# Patient Record
Sex: Female | Born: 1995 | Race: White | Hispanic: No | Marital: Single | Smoking: Never smoker
Health system: Southern US, Community
[De-identification: ages and names within clinical notes are randomized; demographics above are authoritative.]

---
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
## 2016-05-12 ENCOUNTER — Other Ambulatory Visit: Payer: Self-pay | Admitting: Internal Medicine

## 2016-05-18 ENCOUNTER — Ambulatory Visit (INDEPENDENT_AMBULATORY_CARE_PROVIDER_SITE_OTHER): Payer: BLUE CROSS/BLUE SHIELD | Admitting: Internal Medicine

## 2016-05-18 ENCOUNTER — Encounter: Payer: Self-pay | Admitting: Internal Medicine

## 2016-05-18 VITALS — BP 118/76 | HR 84 | Temp 98.9°F | Ht 67.5 in | Wt 154.0 lb

## 2016-05-18 DIAGNOSIS — Z0000 Encounter for general adult medical examination without abnormal findings: Secondary | ICD-10-CM | POA: Diagnosis not present

## 2016-05-18 DIAGNOSIS — Z23 Encounter for immunization: Secondary | ICD-10-CM | POA: Diagnosis not present

## 2016-05-18 DIAGNOSIS — Z111 Encounter for screening for respiratory tuberculosis: Secondary | ICD-10-CM

## 2016-05-18 MED ORDER — NORETHINDRONE ACET-ETHINYL EST 1-20 MG-MCG PO TABS: 1.0000 | ORAL_TABLET | Freq: Every day | ORAL | Status: DC

## 2016-05-18 NOTE — Addendum Note (Signed)
Addended by: Roena MaladyEVONTENNO, Abdulrahman Bracey Y on: 05/18/2016 04:54 PM   Modules accepted: Orders, SmartSet

## 2016-05-18 NOTE — Progress Notes (Signed)
Subjective:    Patient ID: Candace Mcfarland, female    DOB: 22-Jan-1996, 20 y.o.   MRN: [id number]  HPI  Pt presents to the clinic today for her annual exam.  Flu: 10/2014 Tetanus: 2011 Dentist: biannually  [facility] reviewed, she has never had HPV vaccines  Diet: She does eat meat. She consumes fruits and veggies daily. She does eat some fried food. She drinks mostly water and soda. Exercise: [place]  Review of Systems      No past medical history on file.  Current Outpatient Prescriptions  Medication Sig Dispense Refill  . adapalene (DIFFERIN) 0.1 % cream Apply 1 application topically at bedtime.     . clindamycin (CLEOCIN T) 1 % lotion Apply 1 application topically daily.     . norethindrone-ethinyl estradiol (JUNEL 1/20) 1-20 MG-MCG tablet Take 1 tablet by mouth daily. PLEASE SCHEDULE ANNUAL PHYSICAL FOR MORE REFILLS 1 Package 0   No current facility-administered medications for this visit.    No Known Allergies  Family History  Problem Relation Age of Onset  . Hypertension Father   . Cancer Maternal Grandfather     lung  . Cancer Paternal Grandmother     skin    Social History   Social History  . Marital Status: Single    Spouse Name: N/A  . Number of Children: N/A  . Years of Education: N/A   Occupational History  . Not on file.   Social History Main Topics  . Smoking status: Never Smoker   . Smokeless tobacco: Never Used  . Alcohol Use: No  . Drug Use: Not on file  . Sexual Activity: No   Other Topics Concern  . Not on file   Social History Narrative     Constitutional: Denies fever, malaise, fatigue, headache or abrupt weight changes.  HEENT: Denies eye pain, eye redness, ear pain, ringing in the ears, wax buildup, runny nose, nasal congestion, bloody nose, or sore throat. Respiratory: Denies difficulty breathing, shortness of breath, cough or sputum production.   Cardiovascular: Denies chest pain, chest tightness, palpitations or swelling in  the hands or feet.  Gastrointestinal: Denies abdominal pain, bloating, constipation, diarrhea or blood in the stool.  GU: Denies urgency, frequency, pain with urination, burning sensation, blood in urine, odor or discharge. Musculoskeletal: Denies decrease in range of motion, difficulty with gait, muscle pain or joint pain and swelling.  Skin: Pt reports acne. Denies redness, rashes, or ulcercations.  Neurological: Denies dizziness, difficulty with memory, difficulty with speech or problems with balance and coordination.  Psych: Denies anxiety, depression, SI/HI.  No other specific complaints in a complete review of systems (except as listed in HPI above).  Objective:   Physical Exam  BP 118/76 mmHg  Pulse 84  Temp(Src) 98.9 F (37.2 C) (Oral)  Ht 5' 7.5" (1.715 m)  Wt 154 lb (69.854 kg)  BMI 23.75 kg/m2  SpO2 98%  LMP 05/08/2016  Wt Readings from Last 3 Encounters:  06/19/15 160 lb (72.576 kg) (90 %*, Z = 1.29)  06/09/15 156 lb (70.761 kg) (88 %*, Z = 1.19)  11/07/14 164 lb (74.39 kg) (92 %*, Z = 1.42)   * Growth percentiles are based on CDC 2-20 Years data.    General: Appears her stated age, well developed, well nourished in NAD. Skin: Warm, dry and intact. Liner cut marks noted on left upper thigh. HEENT: Head: normal shape and size; Eyes: sclera white, no icterus, conjunctiva pink, PERRLA and EOMs intact; Ears: Tm's  gray and intact, normal light reflex, some wax buildup; Throat/Mouth: Teeth present, mucosa pink and moist, no exudate, lesions or ulcerations noted.  Neck: Neck supple, trachea midline. No masses, lumps or thyromegaly present.  Cardiovascular: Normal rate and rhythm. S1,S2 noted.  No murmur, rubs or gallops noted.  Pulmonary/Chest: Normal effort and positive vesicular breath sounds. No respiratory distress. No wheezes, rales or ronchi noted.  Abdomen: Soft and nontender. Normal bowel sounds. No distention or masses noted. Liver, spleen and kidneys non  palpable. Musculoskeletal: Normal range of motion. Strength 5/5 BUE/BLE. No difficulty with gait.  Neurological: Alert and oriented. Cranial nerves II-XII grossly intact. Coordination normal.  Psychiatric: Mood and affect normal. Behavior is normal. Judgment and thought content normal.         Assessment & Plan:  Preventative Health Maintenance.  Encouraged her to get a flu shot in the fall Tetanus UTD Encouraged her to visit a dentist annually Encouraged her to continue to be active and consume a well balanced diet Will check CBC, CMET, Lipid today Qunatiferon TB test ordered for college form HPV # 1 today, make nurse visits for subsequent vaccines  RTC in 1 year or sooner if needed Candace Reaper, NP

## 2016-05-18 NOTE — Patient Instructions (Signed)
Health Maintenance, Female Adopting a healthy lifestyle and getting preventive care can go a long way to promote health and wellness. Talk with your health care provider about what schedule of regular examinations is right for you. This is a good chance for you to check in with your provider about disease prevention and staying healthy. In between checkups, there are plenty of things you can do on your own. Experts have done a lot of research about which lifestyle changes and preventive measures are most likely to keep you healthy. Ask your health care provider for more information. WEIGHT AND DIET  Eat a healthy diet  Be sure to include plenty of vegetables, fruits, low-fat dairy products, and lean protein.  Do not eat a lot of foods high in solid fats, added sugars, or salt.  Get regular exercise. This is one of the most important things you can do for your health.  Most adults should exercise for at least 150 minutes each week. The exercise should increase your heart rate and make you sweat (moderate-intensity exercise).  Most adults should also do strengthening exercises at least twice a week. This is in addition to the moderate-intensity exercise.  Maintain a healthy weight  Body mass index (BMI) is a measurement that can be used to identify possible weight problems. It estimates body fat based on height and weight. Your health care provider can help determine your BMI and help you achieve or maintain a healthy weight.  For females 20 years of age and older:   A BMI below 18.5 is considered underweight.  A BMI of 18.5 to 24.9 is normal.  A BMI of 25 to 29.9 is considered overweight.  A BMI of 30 and above is considered obese.  Watch levels of cholesterol and blood lipids  You should start having your blood tested for lipids and cholesterol at 20 years of age, then have this test every 5 years.  You may need to have your cholesterol levels checked more often if:  Your lipid  or cholesterol levels are high.  You are older than 19 years of age.  You are at high risk for heart disease.  CANCER SCREENING   Lung Cancer  Lung cancer screening is recommended for adults 55-80 years old who are at high risk for lung cancer because of a history of smoking.  A yearly low-dose CT scan of the lungs is recommended for people who:  Currently smoke.  Have quit within the past 15 years.  Have at least a 30-pack-year history of smoking. A pack year is smoking an average of one pack of cigarettes a day for 1 year.  Yearly screening should continue until it has been 15 years since you quit.  Yearly screening should stop if you develop a health problem that would prevent you from having lung cancer treatment.  Breast Cancer  Practice breast self-awareness. This means understanding how your breasts normally appear and feel.  It also means doing regular breast self-exams. Let your health care provider know about any changes, no matter how small.  If you are in your 20s or 30s, you should have a clinical breast exam (CBE) by a health care provider every 1-3 years as part of a regular health exam.  If you are 40 or older, have a CBE every year. Also consider having a breast X-ray (mammogram) every year.  If you have a family history of breast cancer, talk to your health care provider about genetic screening.  If you   are at high risk for breast cancer, talk to your health care provider about having an MRI and a mammogram every year.  Breast cancer gene (BRCA) assessment is recommended for women who have family members with BRCA-related cancers. BRCA-related cancers include:  Breast.  Ovarian.  Tubal.  Peritoneal cancers.  Results of the assessment will determine the need for genetic counseling and BRCA1 and BRCA2 testing. Cervical Cancer Your health care provider may recommend that you be screened regularly for cancer of the pelvic organs (ovaries, uterus, and  vagina). This screening involves a pelvic examination, including checking for microscopic changes to the surface of your cervix (Pap test). You may be encouraged to have this screening done every 3 years, beginning at age 21.  For women ages 30-65, health care providers may recommend pelvic exams and Pap testing every 3 years, or they may recommend the Pap and pelvic exam, combined with testing for human papilloma virus (HPV), every 5 years. Some types of HPV increase your risk of cervical cancer. Testing for HPV may also be done on women of any age with unclear Pap test results.  Other health care providers may not recommend any screening for nonpregnant women who are considered low risk for pelvic cancer and who do not have symptoms. Ask your health care provider if a screening pelvic exam is right for you.  If you have had past treatment for cervical cancer or a condition that could lead to cancer, you need Pap tests and screening for cancer for at least 20 years after your treatment. If Pap tests have been discontinued, your risk factors (such as having a new sexual partner) need to be reassessed to determine if screening should resume. Some women have medical problems that increase the chance of getting cervical cancer. In these cases, your health care provider may recommend more frequent screening and Pap tests. Colorectal Cancer  This type of cancer can be detected and often prevented.  Routine colorectal cancer screening usually begins at 19 years of age and continues through 19 years of age.  Your health care provider may recommend screening at an earlier age if you have risk factors for colon cancer.  Your health care provider may also recommend using home test kits to check for hidden blood in the stool.  A small camera at the end of a tube can be used to examine your colon directly (sigmoidoscopy or colonoscopy). This is done to check for the earliest forms of colorectal  cancer.  Routine screening usually begins at age 50.  Direct examination of the colon should be repeated every 5-10 years through 19 years of age. However, you may need to be screened more often if early forms of precancerous polyps or small growths are found. Skin Cancer  Check your skin from head to toe regularly.  Tell your health care provider about any new moles or changes in moles, especially if there is a change in a mole's shape or color.  Also tell your health care provider if you have a mole that is larger than the size of a pencil eraser.  Always use sunscreen. Apply sunscreen liberally and repeatedly throughout the day.  Protect yourself by wearing long sleeves, pants, a wide-brimmed hat, and sunglasses whenever you are outside. HEART DISEASE, DIABETES, AND HIGH BLOOD PRESSURE   High blood pressure causes heart disease and increases the risk of stroke. High blood pressure is more likely to develop in:  People who have blood pressure in the high end   of the normal range (130-139/85-89 mm Hg).  People who are overweight or obese.  People who are African American.  If you are 38-23 years of age, have your blood pressure checked every 3-5 years. If you are 61 years of age or older, have your blood pressure checked every year. You should have your blood pressure measured twice--once when you are at a hospital or clinic, and once when you are not at a hospital or clinic. Record the average of the two measurements. To check your blood pressure when you are not at a hospital or clinic, you can use:  An automated blood pressure machine at a pharmacy.  A home blood pressure monitor.  If you are between 45 years and 39 years old, ask your health care provider if you should take aspirin to prevent strokes.  Have regular diabetes screenings. This involves taking a blood sample to check your fasting blood sugar level.  If you are at a normal weight and have a low risk for diabetes,  have this test once every three years after 19 years of age.  If you are overweight and have a high risk for diabetes, consider being tested at a younger age or more often. PREVENTING INFECTION  Hepatitis B  If you have a higher risk for hepatitis B, you should be screened for this virus. You are considered at high risk for hepatitis B if:  You were born in a country where hepatitis B is common. Ask your health care provider which countries are considered high risk.  Your parents were born in a high-risk country, and you have not been immunized against hepatitis B (hepatitis B vaccine).  You have HIV or AIDS.  You use needles to inject street drugs.  You live with someone who has hepatitis B.  You have had sex with someone who has hepatitis B.  You get hemodialysis treatment.  You take certain medicines for conditions, including cancer, organ transplantation, and autoimmune conditions. Hepatitis C  Blood testing is recommended for:  Everyone born from [birth year] through 1964.  Anyone with known risk factors for hepatitis C. Sexually transmitted infections (STIs)  You should be screened for sexually transmitted infections (STIs) including gonorrhea and chlamydia if:  You are sexually active and are younger than 19 years of age.  You are older than 19 years of age and your health care provider tells you that you are at risk for this type of infection.  Your sexual activity has changed since you were last screened and you are at an increased risk for chlamydia or gonorrhea. Ask your health care provider if you are at risk.  If you do not have HIV, but are at risk, it may be recommended that you take a prescription medicine daily to prevent HIV infection. This is called pre-exposure prophylaxis (PrEP). You are considered at risk if:  You are sexually active and do not regularly use condoms or know the HIV status of your partner(s).  You take drugs by injection.  You are sexually  active with a partner who has HIV. Talk with your health care provider about whether you are at high risk of being infected with HIV. If you choose to begin PrEP, you should first be tested for HIV. You should then be tested every 3 months for as long as you are taking PrEP.  PREGNANCY   If you are premenopausal and you may become pregnant, ask your health care provider about preconception counseling.  If you may  become pregnant, take 400 to 800 micrograms (mcg) of folic acid every day.  If you want to prevent pregnancy, talk to your health care provider about birth control (contraception). OSTEOPOROSIS AND MENOPAUSE   Osteoporosis is a disease in which the bones lose minerals and strength with aging. This can result in serious bone fractures. Your risk for osteoporosis can be identified using a bone density scan.  If you are 61 years of age or older, or if you are at risk for osteoporosis and fractures, ask your health care provider if you should be screened.  Ask your health care provider whether you should take a calcium or vitamin D supplement to lower your risk for osteoporosis.  Menopause may have certain physical symptoms and risks.  Hormone replacement therapy may reduce some of these symptoms and risks. Talk to your health care provider about whether hormone replacement therapy is right for you.  HOME CARE INSTRUCTIONS   Schedule regular health, dental, and eye exams.  Stay current with your immunizations.   Do not use any tobacco products including cigarettes, chewing tobacco, or electronic cigarettes.  If you are pregnant, do not drink alcohol.  If you are breastfeeding, limit how much and how often you drink alcohol.  Limit alcohol intake to no more than 1 drink per day for nonpregnant women. One drink equals 12 ounces of beer, 5 ounces of wine, or 1 ounces of hard liquor.  Do not use street drugs.  Do not share needles.  Ask your health care provider for help if  you need support or information about quitting drugs.  Tell your health care provider if you often feel depressed.  Tell your health care provider if you have ever been abused or do not feel safe at home.   This information is not intended to replace advice given to you by your health care provider. Make sure you discuss any questions you have with your health care provider.   Document Released: 05/10/2011 Document Revised: 11/15/2014 Document Reviewed: 09/26/2013 Elsevier Interactive Patient Education [employer].

## 2016-05-19 LAB — CBC
HEMATOCRIT: 42.1 % (ref 36.0–49.0)
HEMOGLOBIN: 14.2 g/dL (ref 12.0–16.0)
MCHC: 33.7 g/dL (ref 31.0–37.0)
MCV: 85.6 fl (ref 78.0–98.0)
PLATELETS: 279 10*3/uL (ref 150.0–575.0)
RBC: 4.92 Mil/uL (ref 3.80–5.70)
RDW: 14 % (ref 11.4–15.5)
WBC: 7.2 10*3/uL (ref 4.5–13.5)

## 2016-05-19 LAB — COMPREHENSIVE METABOLIC PANEL
ALBUMIN: 4.7 g/dL (ref 3.5–5.2)
ALT: 16 U/L (ref 0–35)
AST: 18 U/L (ref 0–37)
Alkaline Phosphatase: 52 U/L (ref 47–119)
BUN: 12 mg/dL (ref 6–23)
CALCIUM: 10.5 mg/dL (ref 8.4–10.5)
CHLORIDE: 101 meq/L (ref 96–112)
CO2: 26 mEq/L (ref 19–32)
CREATININE: 0.87 mg/dL (ref 0.40–1.20)
GFR: 89.5 mL/min (ref >60.00)
Glucose, Bld: 128 mg/dL — ABNORMAL HIGH (ref 70–99)
POTASSIUM: 4.1 meq/L (ref 3.5–5.1)
Sodium: 137 mEq/L (ref 135–145)
Total Bilirubin: 0.5 mg/dL (ref 0.3–1.2)
Total Protein: 8.3 g/dL (ref 6.0–8.3)

## 2016-05-19 LAB — LIPID PANEL
CHOLESTEROL: 202 mg/dL — AB (ref 0–200)
HDL: 65.1 mg/dL (ref >39.00)
LDL CALC: 108 mg/dL — AB (ref 0–99)
NonHDL: 136.59
TRIGLYCERIDES: 141 mg/dL (ref 0.0–149.0)
Total CHOL/HDL Ratio: 3
VLDL: 28.2 mg/dL (ref 0.0–40.0)

## 2016-05-20 LAB — QUANTIFERON TB GOLD ASSAY (BLOOD)
Interferon Gamma Release Assay: NEGATIVE
Mitogen-Nil: >10 IU/mL
Quantiferon Nil Value: 0.09 IU/mL
Quantiferon Tb Ag Minus Nil Value: 0.01 IU/mL

## 2016-06-08 DIAGNOSIS — Z7689 Persons encountering health services in other specified circumstances: Secondary | ICD-10-CM

## 2016-06-23 ENCOUNTER — Telehealth: Payer: Self-pay | Admitting: *Deleted

## 2016-06-23 NOTE — Telephone Encounter (Signed)
PT came in to drop off form to be completed regarding immunizations. Pt stated she had gotten the information previously but unfortunately lost it. Please let her know when it is ready [phone]. Form placed in prescription tower.

## 2016-06-24 NOTE — Telephone Encounter (Signed)
Placed in front office for pick up--pt is aware

## 2016-12-31 ENCOUNTER — Encounter: Payer: Self-pay | Admitting: Internal Medicine

## 2016-12-31 ENCOUNTER — Encounter (INDEPENDENT_AMBULATORY_CARE_PROVIDER_SITE_OTHER): Payer: Self-pay

## 2016-12-31 ENCOUNTER — Ambulatory Visit (INDEPENDENT_AMBULATORY_CARE_PROVIDER_SITE_OTHER): Payer: BLUE CROSS/BLUE SHIELD | Admitting: Internal Medicine

## 2016-12-31 VITALS — BP 120/70 | HR 100 | Temp 97.9°F | Wt 163.0 lb

## 2016-12-31 DIAGNOSIS — Z298 Encounter for other specified prophylactic measures: Secondary | ICD-10-CM | POA: Diagnosis not present

## 2016-12-31 DIAGNOSIS — Z7189 Other specified counseling: Secondary | ICD-10-CM

## 2016-12-31 DIAGNOSIS — R0789 Other chest pain: Secondary | ICD-10-CM

## 2016-12-31 DIAGNOSIS — R7989 Other specified abnormal findings of blood chemistry: Secondary | ICD-10-CM | POA: Diagnosis not present

## 2016-12-31 DIAGNOSIS — Z23 Encounter for immunization: Secondary | ICD-10-CM

## 2016-12-31 DIAGNOSIS — Z7184 Encounter for health counseling related to travel: Secondary | ICD-10-CM

## 2016-12-31 MED ORDER — ATOVAQUONE-PROGUANIL HCL 250-100 MG PO TABS: 1.0000 | ORAL_TABLET | Freq: Every day | ORAL | 0 refills | Status: DC

## 2016-12-31 MED ORDER — TYPHOID VACCINE PO CPDR: 1.0000 | DELAYED_RELEASE_CAPSULE | ORAL | 0 refills | Status: DC

## 2016-12-31 NOTE — Progress Notes (Signed)
Subjective:    Patient ID: Candace Mcfarland, female    DOB: [birth year], 21 y.o.   MRN: [id number]  HPI  Pt presents to the clinic today for ER follow up for elevated D dimer and chest pain. She reports she started having substernal chest pain Friday 11/26/16. [facility] They told her it was most likely anxiety and gave her a RX for Ativan. They did some blood work and the next day, called her and told her that her D dimer was elevated and that she needed to go to the ER. [facility], where the did BLE venous dopplers which were negative. She also had a chest CTA which was negative for PE. By the time she had gotten to the ER, her chest pain subsided, and she has not had any issues since. She did stop her OCP's just to be on the safe side. She was taking it for acne regulation. She just wanted to follow up because [place] is [place] to [place], and wants to make sure she is not going to have any issues. She is also requesting her typhoid vaccine and malaria prophylaxis.  Review of Systems      No past medical history on file.  Current Outpatient Prescriptions  Medication Sig Dispense Refill  . adapalene (DIFFERIN) 0.1 % cream Apply 1 application topically at bedtime.     . clindamycin (CLEOCIN T) 1 % lotion Apply 1 application topically daily.     Marland Kitchen. FLUoxetine (PROZAC) 10 MG capsule TAKE 1 CAPSULE BY MOUTH DAILY IN THE MORNING  0  . hydrOXYzine (ATARAX/VISTARIL) 25 MG tablet Take 25 mg by mouth daily as needed.   0   No current facility-administered medications for this visit.     No Known Allergies  Family History  Problem Relation Age of Onset  . Hypertension Father   . Cancer Maternal Grandfather     lung  . Cancer Paternal Grandmother     skin    Social History   Social History  . Marital status: Single    Spouse name: N/A  . Number of children: N/A  . Years of education: N/A   Occupational History  . Not on file.   Social History  Main Topics  . Smoking status: Never Smoker  . Smokeless tobacco: Never Used  . Alcohol use No  . Drug use: Unknown  . Sexual activity: No   Other Topics Concern  . Not on file   Social History Narrative  . No narrative on file     Constitutional: Denies fever, malaise, fatigue, headache or abrupt weight changes.  Respiratory: Denies difficulty breathing, shortness of breath, cough or sputum production.   Cardiovascular: Denies chest pain, chest tightness, palpitations or swelling in the hands or feet.   No other specific complaints in a complete review of systems (except as listed in HPI above).  Objective:   Physical Exam   BP 120/70   Pulse 100   Temp 97.9 F (36.6 C) (Oral)   Wt 163 lb (73.9 kg)   LMP 12/24/2016   SpO2 99%   BMI 25.15 kg/m  Wt Readings from Last 3 Encounters:  12/31/16 163 lb (73.9 kg) (89 %, Z= 1.25)*  05/18/16 154 lb (69.9 kg) (86 %, Z= 1.06)*  06/19/15 160 lb (72.6 kg) (90 %, Z= 1.29)*   * Growth percentiles are based on CDC 2-20 Years data.    General: Appears her stated age,  well developed, well nourished in NAD. Cardiovascular: Normal rate and rhythm. S1,S2 noted.  No murmur, rubs or gallops noted.  Pulmonary/Chest: Normal effort and positive vesicular breath sounds. No respiratory distress. No wheezes, rales or ronchi noted.   BMET    Component Value Date/Time   NA 137 05/18/2016 1618   K 4.1 05/18/2016 1618   CL 101 05/18/2016 1618   CO2 26 05/18/2016 1618   GLUCOSE 128 (H) 05/18/2016 1618   BUN 12 05/18/2016 1618   CREATININE 0.87 05/18/2016 1618   CALCIUM 10.5 05/18/2016 1618    Lipid Panel     Component Value Date/Time   CHOL 202 (H) 05/18/2016 1618   TRIG 141.0 05/18/2016 1618   HDL 65.10 05/18/2016 1618   CHOLHDL 3 05/18/2016 1618   VLDL 28.2 05/18/2016 1618   LDLCALC 108 (H) 05/18/2016 1618    CBC    Component Value Date/Time   WBC 7.2 05/18/2016 1618   RBC 4.92 05/18/2016 1618   HGB 14.2 05/18/2016 1618    HCT 42.1 05/18/2016 1618   PLT 279.0 05/18/2016 1618   MCV 85.6 05/18/2016 1618   MCHC 33.7 05/18/2016 1618   RDW 14.0 05/18/2016 1618    Hgb A1C No results found for: HGBA1C         Assessment & Plan:   ER follow up for elevated D dimer and chest pain:  ER notes, labs and imaging reviewed No clot Symptoms have resolved She does not want to restart OCP's, I am fine with this She will take a baby ASA the day before she leaves and continue until she comes home Encouraged her to pump her legs every hour while she was on her flight  Encounter for travel visit:  RX for typhoid oral vaccine and malaria prophylaxis  RTC as needed [name], NP

## 2016-12-31 NOTE — Patient Instructions (Signed)

## 2017-01-07 ENCOUNTER — Telehealth: Payer: Self-pay

## 2017-01-07 NOTE — Telephone Encounter (Signed)
Pt left v/m; pt seen 12/31/16 and was given Malarone and Typhoid cap. Pt is supposed to finish taking Typhoid cap on 01/13/17 and start taking Malarone on 01/13/17. Pt wants to know if can take both meds at same time on 01/13/17 or do they have to be spaced apart and if so how far apart to take meds. Pt request cb.

## 2017-01-07 NOTE — Telephone Encounter (Signed)
Yes, can be taken at the same time.

## 2017-01-07 NOTE — Telephone Encounter (Signed)
Left message on voicemail.

## 2017-01-12 ENCOUNTER — Telehealth: Payer: Self-pay | Admitting: Internal Medicine

## 2017-01-12 NOTE — Telephone Encounter (Signed)
[facility]  Please call.

## 2017-01-13 NOTE — Telephone Encounter (Signed)
Left message on voicemail.

## 2017-05-20 ENCOUNTER — Ambulatory Visit (INDEPENDENT_AMBULATORY_CARE_PROVIDER_SITE_OTHER): Payer: BLUE CROSS/BLUE SHIELD | Admitting: Internal Medicine

## 2017-05-20 ENCOUNTER — Encounter: Payer: Self-pay | Admitting: Internal Medicine

## 2017-05-20 VITALS — BP 112/78 | HR 64 | Temp 98.2°F | Ht 67.5 in | Wt 154.5 lb

## 2017-05-20 DIAGNOSIS — Z0001 Encounter for general adult medical examination with abnormal findings: Secondary | ICD-10-CM

## 2017-05-20 DIAGNOSIS — Z0000 Encounter for general adult medical examination without abnormal findings: Secondary | ICD-10-CM | POA: Diagnosis not present

## 2017-05-20 NOTE — Patient Instructions (Signed)

## 2017-05-20 NOTE — Progress Notes (Signed)
Subjective:    Patient ID: Candace Mcfarland, female    DOB: Sep 27, 1996, 21 y.o.   MRN: [id number]  HPI  Pt presents to the clinic today for her annual exam. She is also due to follow up chronic conditions.  Anxiety: She is taking Prozac daily as prescribed. She has Hydroxyzine but reports she does not take it. She denies depression, SI/HI. She follows with Colette Ribas at [facility]  Flu: 08/2015 Tetanus: 06/2010 Dentist: annually  Diet: She does eat meat. She consumes fruits and veggies. She does eat fried foods. She drinks mostly water and coffee.  Exercise: None  Review of Systems  No past medical history on file.  Current Outpatient Prescriptions  Medication Sig Dispense Refill  . adapalene (DIFFERIN) 0.1 % cream Apply 1 application topically at bedtime.     Marland Kitchen atovaquone-proguanil (MALARONE) 250-100 MG TABS tablet Take 1 tablet by mouth daily. Start 3/8, Take last pill on 3/26 19 tablet 0  . clindamycin (CLEOCIN T) 1 % lotion Apply 1 application topically daily.     Marland Kitchen FLUoxetine (PROZAC) 10 MG capsule TAKE 1 CAPSULE BY MOUTH DAILY IN THE MORNING  0  . hydrOXYzine (ATARAX/VISTARIL) 25 MG tablet Take 25 mg by mouth daily as needed.   0  . typhoid (VIVOTIF) DR capsule Take 1 capsule by mouth every other day. Start 3/2. Should Finish 3/8 4 capsule 0   No current facility-administered medications for this visit.     No Known Allergies  Family History  Problem Relation Age of Onset  . Hypertension Father   . Cancer Maternal Grandfather        lung  . Cancer Paternal Grandmother        skin    Social History   Social History  . Marital status: Single    Spouse name: N/A  . Number of children: N/A  . Years of education: N/A   Occupational History  . Not on file.   Social History Main Topics  . Smoking status: Never Smoker  . Smokeless tobacco: Never Used  . Alcohol use No  . Drug use: Unknown  . Sexual activity: No   Other Topics Concern  . Not on file    Social History Narrative  . No narrative on file     Constitutional: Denies fever, malaise, fatigue, headache or abrupt weight changes.  HEENT: Denies eye pain, eye redness, ear pain, ringing in the ears, wax buildup, runny nose, nasal congestion, bloody nose, or sore throat. Respiratory: Denies difficulty breathing, shortness of breath, cough or sputum production.   Cardiovascular: Denies chest pain, chest tightness, palpitations or swelling in the hands or feet.  Gastrointestinal: Denies abdominal pain, bloating, constipation, diarrhea or blood in the stool.  GU: Denies urgency, frequency, pain with urination, burning sensation, blood in urine, odor or discharge. Musculoskeletal: Denies decrease in range of motion, difficulty with gait, muscle pain or joint pain and swelling.  Skin: Denies redness, rashes, lesions or ulcercations.  Neurological: Denies dizziness, difficulty with memory, difficulty with speech or problems with balance and coordination.  Psych: Denies anxiety, depression, SI/HI.  No other specific complaints in a complete review of systems (except as listed in HPI above).     Objective:   Physical Exam   BP 112/78 (BP Location: Right Arm, Patient Position: Sitting, Cuff Size: Normal)   Pulse 64   Temp 98.2 F (36.8 C) (Oral)   Ht 5' 7.5" (1.715 m)   Wt 154 lb 8 oz (70.1  kg)   LMP 05/16/2017   SpO2 98%   BMI 23.84 kg/m  Wt Readings from Last 3 Encounters:  05/20/17 154 lb 8 oz (70.1 kg) (84 %, Z= 0.99)*  12/31/16 163 lb (73.9 kg) (89 %, Z= 1.25)*  05/18/16 154 lb (69.9 kg) (86 %, Z= 1.06)*   * Growth percentiles are based on CDC 2-20 Years data.    General: Appears her stated age, well developed, well nourished in NAD. Skin: Warm, dry and intact. HEENT: Head: normal shape and size; Eyes: sclera white, no icterus, conjunctiva pink, PERRLA and EOMs intact; Ears: Tm's gray and intact, normal light reflex; Throat/Mouth: Teeth present, mucosa pink and moist,  no exudate, lesions or ulcerations noted.  Neck:  Neck supple, trachea midline. Cervical anterior adenopathy noted on the right. No thyromegaly present.  Cardiovascular: Normal rate and rhythm. S1,S2 noted.  No murmur, rubs or gallops noted. No JVD or BLE edema.  Pulmonary/Chest: Normal effort and positive vesicular breath sounds. No respiratory distress. No wheezes, rales or ronchi noted.  Abdomen: Soft and nontender. Normal bowel sounds. No distention or masses noted.  Musculoskeletal: Strength 5/5 BUE/BLE. No difficulty with gait.  Neurological: Alert and oriented. Cranial nerves II-XII grossly intact. Coordination normal.  Psychiatric: Mood and affect normal. Behavior is normal. Judgment and thought content normal.     BMET    Component Value Date/Time   NA 137 05/18/2016 1618   K 4.1 05/18/2016 1618   CL 101 05/18/2016 1618   CO2 26 05/18/2016 1618   GLUCOSE 128 (H) 05/18/2016 1618   BUN 12 05/18/2016 1618   CREATININE 0.87 05/18/2016 1618   CALCIUM 10.5 05/18/2016 1618    Lipid Panel     Component Value Date/Time   CHOL 202 (H) 05/18/2016 1618   TRIG 141.0 05/18/2016 1618   HDL 65.10 05/18/2016 1618   CHOLHDL 3 05/18/2016 1618   VLDL 28.2 05/18/2016 1618   LDLCALC 108 (H) 05/18/2016 1618    CBC    Component Value Date/Time   WBC 7.2 05/18/2016 1618   RBC 4.92 05/18/2016 1618   HGB 14.2 05/18/2016 1618   HCT 42.1 05/18/2016 1618   PLT 279.0 05/18/2016 1618   MCV 85.6 05/18/2016 1618   MCHC 33.7 05/18/2016 1618   RDW 14.0 05/18/2016 1618    Hgb A1C No results found for: HGBA1C         Assessment & Plan:   Preventative Health Maintenance:  Encouraged her to get a flu shot in the fall Tetanus UTD Encouraged her to consume a balanced diet and exercise regimen Advised her to see a dentist annually She declines labs today  RTC in 1 year, sooner if needed Nicki ReaperBAITY, Sasha Rueth, NP

## 2018-05-23 ENCOUNTER — Encounter: Payer: BLUE CROSS/BLUE SHIELD | Admitting: Internal Medicine

## 2018-11-10 ENCOUNTER — Telehealth: Payer: Self-pay | Admitting: Internal Medicine

## 2018-11-10 NOTE — Telephone Encounter (Signed)
Not the way it works. You have to come in for a travel visit. I have no idea where she is going or if Typhoid (which is an oral vaccine) is even appropriate for where she is going.

## 2018-11-10 NOTE — Telephone Encounter (Signed)
Pt called office requesting a order to be put in to get a Typhoid shot at [facility] in [place]. She is leaving to go to [place] tomorrow.

## 2018-11-13 NOTE — Telephone Encounter (Signed)
Just now able to view msg, pt should already be gone for trip

## 2019-04-03 ENCOUNTER — Encounter: Payer: Self-pay | Admitting: Internal Medicine

## 2019-04-03 ENCOUNTER — Ambulatory Visit (INDEPENDENT_AMBULATORY_CARE_PROVIDER_SITE_OTHER): Payer: PRIVATE HEALTH INSURANCE | Admitting: Internal Medicine

## 2019-04-03 ENCOUNTER — Other Ambulatory Visit: Payer: Self-pay

## 2019-04-03 VITALS — Temp 99.5°F

## 2019-04-03 DIAGNOSIS — J038 Acute tonsillitis due to other specified organisms: Secondary | ICD-10-CM

## 2019-04-03 DIAGNOSIS — B9689 Other specified bacterial agents as the cause of diseases classified elsewhere: Secondary | ICD-10-CM | POA: Diagnosis not present

## 2019-04-03 MED ORDER — AMOXICILLIN 500 MG PO CAPS: 500.0000 mg | ORAL_CAPSULE | Freq: Three times a day (TID) | ORAL | 0 refills | Status: DC

## 2019-04-03 MED ORDER — PREDNISONE 10 MG PO TABS: ORAL_TABLET | ORAL | 0 refills | Status: DC

## 2019-04-03 NOTE — Progress Notes (Signed)
Virtual Visit via Video Note  I connected with Candace LawmanCamille J Mcfarland on 04/03/19 at  8:15 AM EDT by a video enabled telemedicine application and verified that I am speaking with the correct person using two identifiers.  Location: Patient: Home Provider: Office   I discussed the limitations of evaluation and management by telemedicine and the availability of in person appointments. The patient expressed understanding and agreed to proceed.  History of Present Illness:  Pt reports swollen tonsils and glands. This started 3-4 days ago but seems to be getting worse. The right is worse than the left. She is having difficulty swallowing. She reports associated runny nose, nasal congestion and fever. She sees white patches on the back of her throat. She denies chills or body aches. She has tried warm salt water gargles, Ibuprofen and Sudafed with minimal relief. She has not had sick contacts that she is aware of.     No past medical history on file.  Current Outpatient Medications  Medication Sig Dispense Refill  . FLUoxetine (PROZAC) 20 MG capsule Take 20 mg by mouth daily.      No current facility-administered medications for this visit.     No Known Allergies  Family History  Problem Relation Age of Onset  . Hypertension Father   . Cancer Maternal Grandfather        lung  . Cancer Paternal Grandmother        skin    Social History   Socioeconomic History  . Marital status: Single    Spouse name: Not on file  . Number of children: Not on file  . Years of education: Not on file  . Highest education level: Not on file  Occupational History  . Not on file  Social Needs  . Financial resource strain: Not on file  . Food insecurity:    Worry: Not on file    Inability: Not on file  . Transportation needs:    Medical: Not on file    Non-medical: Not on file  Tobacco Use  . Smoking status: Never Smoker  . Smokeless tobacco: Never Used  Substance and Sexual Activity  . Alcohol  use: No    Alcohol/week: 0.0 standard drinks  . Drug use: No  . Sexual activity: Never  Lifestyle  . Physical activity:    Days per week: Not on file    Minutes per session: Not on file  . Stress: Not on file  Relationships  . Social connections:    Talks on phone: Not on file    Gets together: Not on file    Attends religious service: Not on file    Active member of club or organization: Not on file    Attends meetings of clubs or organizations: Not on file    Relationship status: Not on file  . Intimate partner violence:    Fear of current or ex partner: Not on file    Emotionally abused: Not on file    Physically abused: Not on file    Forced sexual activity: Not on file  Other Topics Concern  . Not on file  Social History Narrative  . Not on file     Constitutional: Pt reports fever and malaise. Denies fatigue, headache or abrupt weight changes.  HEENT: Pt reports runny nose, nasal congestion, sore throat and swollen glands. Denies eye pain, eye redness, ear pain, ringing in the ears, wax buildup, runny nose, nasal congestion, bloody nose. Respiratory: Denies difficulty breathing, shortness of breath,  cough or sputum production.   Cardiovascular: Denies chest pain, chest tightness, palpitations or swelling in the hands or feet.  Skin: Denies redness, rashes, lesions or ulcercations.   No other specific complaints in a complete review of systems (except as listed in HPI above).  Temp 99.5 F (37.5 C) (Oral)   Wt Readings from Last 3 Encounters:  05/20/17 154 lb 8 oz (70.1 kg) (84 %, Z= 0.99)*  12/31/16 163 lb (73.9 kg) (89 %, Z= 1.25)*  05/18/16 154 lb (69.9 kg) (86 %, Z= 1.06)*   * Growth percentiles are based on CDC (Girls, 2-20 Years) data.    General: Appears her stated age, in NAD. Skin: Warm, dry and intact. No rashes noted. HEENT: Unable to visualize posterior pharynx. She reports tonsils are almost touching uvula. Tonsils have white patches bilaterally.  She reports uvula rises midline. You can tell by her voice that there is swelling of the throat present. Pulmonary/Chest: Normal effort. No respiratory distress. Neurological: Alert and oriented.    BMET    Component Value Date/Time   NA 137 05/18/2016 1618   K 4.1 05/18/2016 1618   CL 101 05/18/2016 1618   CO2 26 05/18/2016 1618   GLUCOSE 128 (H) 05/18/2016 1618   BUN 12 05/18/2016 1618   CREATININE 0.87 05/18/2016 1618   CALCIUM 10.5 05/18/2016 1618    Lipid Panel     Component Value Date/Time   CHOL 202 (H) 05/18/2016 1618   TRIG 141.0 05/18/2016 1618   HDL 65.10 05/18/2016 1618   CHOLHDL 3 05/18/2016 1618   VLDL 28.2 05/18/2016 1618   LDLCALC 108 (H) 05/18/2016 1618    CBC    Component Value Date/Time   WBC 7.2 05/18/2016 1618   RBC 4.92 05/18/2016 1618   HGB 14.2 05/18/2016 1618   HCT 42.1 05/18/2016 1618   PLT 279.0 05/18/2016 1618   MCV 85.6 05/18/2016 1618   MCHC 33.7 05/18/2016 1618   RDW 14.0 05/18/2016 1618    Hgb A1C No results found for: HGBA1C      Assessment and Plan:  Bacterial Tonsillitis:  RX for Amoxil 500 mg TID x 10 days RX for Pred Taper x 6 days Continue salt water gargles Avoid Ibuprofen while on Prednisone Can take Tylenol for fever Advised if no improvement with treatment provided, would recommend ER eval to r/o peritonsillar abscess  Follow Up Instructions:    I discussed the assessment and treatment plan with the patient. The patient was provided an opportunity to ask questions and all were answered. The patient agreed with the plan and demonstrated an understanding of the instructions.   The patient was advised to call back or seek an in-person evaluation if the symptoms worsen or if the condition fails to improve as anticipated.    [name], NP

## 2019-04-03 NOTE — Patient Instructions (Signed)
Tonsillitis    Tonsillitis is an infection of the throat. This infection causes the tonsils to become red, tender, and swollen. Tonsils are tissues in the back of your throat. If bacteria caused your infection, antibiotic medicine will be given to you. Sometimes, symptoms of this infection can be treated with the use of medicines that lessen swelling (steroids). If your tonsillitis is very bad (severe) and happens often, you may need to get your tonsils removed (tonsillectomy).  Follow these instructions at home:  Medicines   Take over-the-counter and prescription medicines only as told by your doctor.   If you were prescribed an antibiotic, take it as told by your doctor. Do not stop taking the antibiotic even if you start to feel better.  Eating and drinking   Drink enough fluid to keep your pee (urine) clear or pale yellow.   While your throat is sore, eat soft or liquid foods like:  ? Soup.  ? Sherbert.  ? Instant breakfast drinks.   Drink warm fluids.   Eat frozen ice pops.  General instructions   Rest as much as possible and get plenty of sleep.   Gargle with a salt-water mixture 3-4 times a day or as needed. To make a salt-water mixture, completely dissolve -1 tsp of salt in 1 cup of warm water.   Wash your hands often with soap and water. If there is no soap and water, use hand sanitizer.   Do not share cups, bottles, or other utensils until your symptoms are gone.   Do not smoke. If you need help quitting, ask your doctor.   Keep all follow-up visits as told by your doctor. This is important.  Contact a doctor if:   You have large, tender lumps in your neck.   You have a fever that does not go away after 2-3 days.   You have a rash.   You cough up green, yellow-brown, or bloody fluid.   You cannot swallow liquids or food for 24 hours.   Only one of your tonsils is swollen.  Get help right away if:   You have any new symptoms such as:  ? Vomiting.  ? Very bad headache.  ? Stiff  neck.  ? Chest pain.  ? Trouble breathing or swallowing.   You have very bad throat pain and you also have drooling or voice changes.   You have very bad pain that is not helped by medicine.   You cannot fully open your mouth.   You have redness, swelling, or severe pain anywhere in your neck.  Summary   Tonsillitis causes your tonsils to be red, tender, and swollen.   While your throat is sore, eat soft or liquid foods.   Gargle with a salt-water mixture 3-4 times a day or as needed.   Do not share cups, bottles, or other utensils until your symptoms are gone.  This information is not intended to replace advice given to you by your health care provider. Make sure you discuss any questions you have with your health care provider.  Document Released: 04/12/2008 Document Revised: 11/30/2016 Document Reviewed: 11/30/2016  [employer]vier Interactive Patient Education  2019 [employer]

## 2019-10-24 ENCOUNTER — Encounter: Payer: Self-pay | Admitting: Internal Medicine

## 2019-10-24 ENCOUNTER — Ambulatory Visit (INDEPENDENT_AMBULATORY_CARE_PROVIDER_SITE_OTHER): Payer: PRIVATE HEALTH INSURANCE | Admitting: Internal Medicine

## 2019-10-24 ENCOUNTER — Other Ambulatory Visit: Payer: Self-pay

## 2019-10-24 ENCOUNTER — Telehealth: Payer: Self-pay

## 2019-10-24 VITALS — BP 116/72 | HR 72 | Temp 97.7°F | Wt 150.0 lb

## 2019-10-24 DIAGNOSIS — J029 Acute pharyngitis, unspecified: Secondary | ICD-10-CM

## 2019-10-24 DIAGNOSIS — J020 Streptococcal pharyngitis: Secondary | ICD-10-CM | POA: Diagnosis not present

## 2019-10-24 DIAGNOSIS — K121 Other forms of stomatitis: Secondary | ICD-10-CM

## 2019-10-24 LAB — POCT RAPID STREP A (OFFICE): Rapid Strep A Screen: POSITIVE — AB

## 2019-10-24 MED ORDER — AMOXICILLIN 500 MG PO CAPS: 500.0000 mg | ORAL_CAPSULE | Freq: Three times a day (TID) | ORAL | 0 refills | Status: DC

## 2019-10-24 MED ORDER — MAGIC MOUTHWASH W/LIDOCAINE: 5.0000 mL | Freq: Four times a day (QID) | ORAL | 0 refills | Status: DC | PRN

## 2019-10-24 NOTE — Patient Instructions (Signed)
Strep Throat, Adult °Strep throat is an infection of the throat. It is caused by germs (bacteria). Strep throat is common during the cold months of the year. It mostly affects children who are 5-22 years old. However, people of all ages can get it at any time of the year. °When strep throat affects the tonsils, it is called tonsillitis. When it affects the back of the throat, it is called pharyngitis. This infection spreads from person to person through coughing, sneezing, or having close contact. °What are the causes? °This condition is caused by the Streptococcus pyogenes germ. °What increases the risk? °You are more likely to develop this condition if: °· You care for young children. Children are more likely to get strep throat and may spread it to others. °· You go to crowded places. Germs can spread easily in such places. °· You kiss or touch someone who has strep throat. °What are the signs or symptoms? °Symptoms of this condition include: °· Fever or chills. °· Redness, swelling, or pain in the tonsils or throat. °· Pain or trouble when swallowing. °· White or yellow spots on the tonsils or throat. °· Tender glands in the neck and under the jaw. °· Bad breath. °· Red rash all over the body. This is rare. °How is this treated? °This condition may be treated with: °· Medicines that kill germs (antibiotics). °· Medicines that treat pain or fever. These include: °? Ibuprofen or acetaminophen. °? Aspirin, only for patients who are over the age of 18. °? Throat lozenges. °? Throat sprays. °Follow these instructions at home: °Medicines ° °· Take over-the-counter and prescription medicines only as told by your doctor. °· Take your antibiotic medicine as told by your doctor. Do not stop taking the antibiotic even if you start to feel better. °Eating and drinking ° °· If you have trouble swallowing, eat soft foods until your throat feels better. °· Drink enough fluid to keep your pee (urine) pale yellow. °· To help  with pain, you may have: °? Warm fluids, such as soup and tea. °? Cold fluids, such as frozen desserts or popsicles. °General instructions °· Rinse your mouth (gargle) with a salt-water mixture 3-4 times a day or as needed. To make a salt-water mixture, dissolve ½-1 tsp (3-6 g) of salt in 1 cup (237 mL) of warm water. °· Rest as much as you can. °· Stay home from work or school until you have been taking antibiotics for 24 hours. °· Avoid smoking or being around people who smoke. °· Keep all follow-up visits as told by your doctor. This is important. °How is this prevented? ° °· Do not share food, drinking cups, or personal items. They can cause the germs to spread. °· Wash your hands well with soap and water. Make sure that all people in your house wash their hands well. °· Have family members tested if they have a fever or a sore throat. They may need an antibiotic if they have strep throat. °Contact a doctor if: °· You have swelling in your neck that keeps getting bigger. °· You get a rash, cough, or earache. °· You cough up a thick fluid that is green, yellow-brown, or bloody. °· You have pain that does not get better with medicine. °· Your symptoms get worse instead of getting better. °· You have a fever. °Get help right away if: °· You vomit. °· You have a very bad headache. °· Your neck hurts or feels stiff. °· You have   chest pain or are short of breath. °· You have drooling, very bad throat pain, or changes in your voice. °· Your neck is swollen, or the skin gets red and tender. °· Your mouth is dry, or you are peeing less than normal. °· You keep feeling more tired or have trouble waking up. °· Your joints are red or painful. °Summary °· Strep throat is an infection of the throat. It is caused by germs (bacteria). °· This infection can spread from person to person through coughing, sneezing, or having close contact. °· Take your medicines, including antibiotics, as told by your doctor. Do not stop taking  the antibiotic even if you start to feel better. °· To prevent the spread of germs, wash your hands well with soap and water. Have others do the same. Do not share food, drinking cups, or personal items. °· Get help right away if you have a bad headache, chest pain, shortness of breath, a stiff or painful neck, or you vomit. °This information is not intended to replace advice given to you by your health care provider. Make sure you discuss any questions you have with your health care provider. °Document Released: 04/12/2008 Document Revised: 01/12/2019 Document Reviewed: 01/12/2019 °Elsevier Patient Education © 2020 [employer] ° °

## 2019-10-24 NOTE — Addendum Note (Signed)
Addended by: Lurlean Nanny on: 10/24/2019 03:11 PM   Modules accepted: Orders

## 2019-10-24 NOTE — Telephone Encounter (Signed)
Pt left v/m that she was seen earlier today and tested positive for strep. Pt wants to know if people pt has come in contact with should also be tested. Pt request cb.

## 2019-10-24 NOTE — Progress Notes (Signed)
Subjective:    Patient ID: Candace Mcfarland, female    DOB: [birth year], 23 y.o.   MRN: [id number]  HPI  Pt presents to the clinic today with c/o a sore under her tongue. She noticed this 6 days ago. She reports the sore has gotten bigger in size and is painful. She denies runny nose, nasal congestion, loss of taste or smell, cough or SOB. She denies fever, chills or body aches. She has tried Ibuprofen, salt water gargles and baking soda rinses with minimal relief. She has not had sick contacts or exposure to COVID that she is aware of.  Review of Systems      No past medical history on file.  Current Outpatient Medications  Medication Sig Dispense Refill  . amoxicillin (AMOXIL) 500 MG capsule Take 1 capsule (500 mg total) by mouth 3 (three) times daily. 30 capsule 0  . predniSONE (DELTASONE) 10 MG tablet Take 6 tabs day 1, 5 tabs day 2, 4 tabs day 3, 3 tabs day 4, 2 tabs day 5, 1 tab day 6 21 tablet 0   No current facility-administered medications for this visit.    No Known Allergies  Family History  Problem Relation Age of Onset  . Hypertension Father   . Cancer Maternal Grandfather        lung  . Cancer Paternal Grandmother        skin    Social History   Socioeconomic History  . Marital status: Single    Spouse name: Not on file  . Number of children: Not on file  . Years of education: Not on file  . Highest education level: Not on file  Occupational History  . Not on file  Tobacco Use  . Smoking status: Never Smoker  . Smokeless tobacco: Never Used  Substance and Sexual Activity  . Alcohol use: No    Alcohol/week: 0.0 standard drinks  . Drug use: No  . Sexual activity: Never  Other Topics Concern  . Not on file  Social History Narrative  . Not on file   Social Determinants of Health   Financial Resource Strain:   . Difficulty of Paying Living Expenses: Not on file  Food Insecurity:   . Worried About Charity fundraiser in the Last Year: Not on file   . Ran Out of Food in the Last Year: Not on file  Transportation Needs:   . Lack of Transportation (Medical): Not on file  . Lack of Transportation (Non-Medical): Not on file  Physical Activity:   . Days of Exercise per Week: Not on file  . Minutes of Exercise per Session: Not on file  Stress:   . Feeling of Stress : Not on file  Social Connections:   . Frequency of Communication with Friends and Family: Not on file  . Frequency of Social Gatherings with Friends and Family: Not on file  . Attends Religious Services: Not on file  . Active Member of Clubs or Organizations: Not on file  . Attends Archivist Meetings: Not on file  . Marital Status: Not on file  Intimate Partner Violence:   . Fear of Current or Ex-Partner: Not on file  . Emotionally Abused: Not on file  . Physically Abused: Not on file  . Sexually Abused: Not on file     Constitutional: Denies fever, malaise, fatigue, headache or abrupt weight changes.  HEENT: Pt reports sore of tongue. Denies eye pain, eye redness, ear pain, ringing in  the ears, wax buildup, runny nose, nasal congestion, bloody nose, or sore throat. Respiratory: Denies difficulty breathing, shortness of breath, cough or sputum production.   Cardiovascular: Denies chest pain, chest tightness, palpitations or swelling in the hands or feet.  Skin: Denies redness, rashes, lesions or ulcercations.    No other specific complaints in a complete review of systems (except as listed in HPI above).  Objective:   Physical Exam  BP 116/72   Pulse 72   Temp 97.7 F (36.5 C) (Temporal)   Wt 150 lb (68 kg)   SpO2 98%   BMI 23.15 kg/m   Wt Readings from Last 3 Encounters:  05/20/17 154 lb 8 oz (70.1 kg) (84 %, Z= 0.99)*  12/31/16 163 lb (73.9 kg) (89 %, Z= 1.25)*  05/18/16 154 lb (69.9 kg) (86 %, Z= 1.06)*   * Growth percentiles are based on CDC (Girls, 2-20 Years) data.    General: Appears her stated age, well developed, well nourished in  NAD. Skin: Warm, dry and intact. No rashes noted. HEENT: Head: normal shape and size; Throat/Mouth: Teeth present, mucosa eythematous and moist, white exudate noted on left tonsil. Oral ulceration noted of left lower gum.  Neck:  Bilateral anterior cervical adenopathy noted. Cardiovascular: Normal rate and rhythm. Pulmonary/Chest: Normal effort and positive vesicular breath sounds. No respiratory distress. No wheezes, rales or ronchi noted.  Neurological: Alert and oriented.   BMET    Component Value Date/Time   NA 137 05/18/2016 1618   K 4.1 05/18/2016 1618   CL 101 05/18/2016 1618   CO2 26 05/18/2016 1618   GLUCOSE 128 (H) 05/18/2016 1618   BUN 12 05/18/2016 1618   CREATININE 0.87 05/18/2016 1618   CALCIUM 10.5 05/18/2016 1618    Lipid Panel     Component Value Date/Time   CHOL 202 (H) 05/18/2016 1618   TRIG 141.0 05/18/2016 1618   HDL 65.10 05/18/2016 1618   CHOLHDL 3 05/18/2016 1618   VLDL 28.2 05/18/2016 1618   LDLCALC 108 (H) 05/18/2016 1618    CBC    Component Value Date/Time   WBC 7.2 05/18/2016 1618   RBC 4.92 05/18/2016 1618   HGB 14.2 05/18/2016 1618   HCT 42.1 05/18/2016 1618   PLT 279.0 05/18/2016 1618   MCV 85.6 05/18/2016 1618   MCHC 33.7 05/18/2016 1618   RDW 14.0 05/18/2016 1618    Hgb A1C No results found for: HGBA1C         Assessment & Plan:   Oral Ulceration:  RX for Magic Mouthwash with Lidocaine 5 mL's QID x 5 days Stop Ibuprofen OTC  Strep Throat:  RST: positive RX for Amoxil 500 mg TID x 10 days  Return precautions discussed  [name], NP This visit occurred during the SARS-CoV-2 public health emergency.  Safety protocols were in place, including screening questions prior to the visit, additional usage of staff PPE, and extensive cleaning of exam room while observing appropriate contact time as indicated for disinfecting solutions.

## 2019-10-24 NOTE — Telephone Encounter (Signed)
No, not unless they have symptoms

## 2019-10-25 NOTE — Telephone Encounter (Signed)
Pt is aware as instructed and expressed understanding 

## 2020-05-07 ENCOUNTER — Telehealth: Payer: Self-pay | Admitting: Internal Medicine

## 2020-05-07 NOTE — Telephone Encounter (Signed)
Patient caleld today requesting a new script She stated she is due for a refill but the medication was prescribed by her previous doctor.  Patient requested Abilify .      [facility] - [place], [place] - [address]

## 2020-05-07 NOTE — Telephone Encounter (Signed)
She was recently admitted for psych issues, needs hospital follow up. I usually do not prescribe Abilify, typically prescribed by psych.

## 2020-05-09 NOTE — Telephone Encounter (Signed)
No answer, was not able to lmovm--- pt needs to schedule a hosp f/u

## 2020-08-21 NOTE — Telephone Encounter (Signed)
Pt is aware that per Regina's previous note in 05/2020... also we have not seen pt for any chronic conditions since 2018... pt must schedule visit and she is aware, expressed understanding

## 2020-08-21 NOTE — Telephone Encounter (Signed)
Pt has not been seen or evaluated for this... she was told that she must have an appointment to be evaluated--this is a psych medication

## 2020-08-21 NOTE — Telephone Encounter (Signed)
Patient called in again asking to get a bridge prescription for this medication if possible. Please advise.

## 2020-08-21 NOTE — Telephone Encounter (Signed)
Patient called in stating she is needing a new script for abilify 5mg . Advised patient not currently on medication list. Patient states that that is an error and she takes this daily and is completely out. Please advise if able to fill and send to  [facility] - [place], [place] - [address] Phone:  [phone]  Fax:  [phone]

## 2020-08-22 NOTE — Telephone Encounter (Signed)
Patient called in again checking on status of refill. Notified her that an appointment is needed and notified her that CMA discussed this with her as well. Patient got upset and stated we are the ones that originally prescribed the medicine and we lost it. Notified patient we did not and an appointment is needed. Offered patient an appointment 10/19. Patient refused and stated that will not help.

## 2023-01-13 ENCOUNTER — Ambulatory Visit (HOSPITAL_COMMUNITY)
Admission: EM | Admit: 2023-01-13 | Discharge: 2023-01-14 | Disposition: A | Discharge status: Other Acute Inpatient Hospital | Payer: 59 | Attending: Family Medicine | Admitting: Family Medicine

## 2023-01-13 DIAGNOSIS — F250 Schizoaffective disorder, bipolar type: Secondary | ICD-10-CM | POA: Diagnosis present

## 2023-01-13 DIAGNOSIS — Z79899 Other long term (current) drug therapy: Secondary | ICD-10-CM | POA: Diagnosis not present

## 2023-01-13 DIAGNOSIS — Z9152 Personal history of nonsuicidal self-harm: Secondary | ICD-10-CM | POA: Diagnosis not present

## 2023-01-13 DIAGNOSIS — G4700 Insomnia, unspecified: Secondary | ICD-10-CM | POA: Insufficient documentation

## 2023-01-13 DIAGNOSIS — Z1152 Encounter for screening for COVID-19: Secondary | ICD-10-CM | POA: Insufficient documentation

## 2023-01-13 DIAGNOSIS — F200 Paranoid schizophrenia: Secondary | ICD-10-CM | POA: Insufficient documentation

## 2023-01-13 DIAGNOSIS — F1910 Other psychoactive substance abuse, uncomplicated: Secondary | ICD-10-CM | POA: Diagnosis not present

## 2023-01-13 DIAGNOSIS — I498 Other specified cardiac arrhythmias: Secondary | ICD-10-CM | POA: Diagnosis not present

## 2023-01-13 LAB — CBC WITH DIFFERENTIAL/PLATELET
Abs Immature Granulocytes: 0.01 10*3/uL (ref 0.00–0.07)
Basophils Absolute: 0.1 10*3/uL (ref 0.0–0.1)
Basophils Relative: 1 %
Eosinophils Absolute: 0.1 10*3/uL (ref 0.0–0.5)
Eosinophils Relative: 2 %
HCT: 36.3 % (ref 36.0–46.0)
Hemoglobin: 12.2 g/dL (ref 12.0–15.0)
Immature Granulocytes: 0 %
Lymphocytes Relative: 30 %
Lymphs Abs: 1.8 10*3/uL (ref 0.7–4.0)
MCH: 27.5 pg (ref 26.0–34.0)
MCHC: 33.6 g/dL (ref 30.0–36.0)
MCV: 81.8 fL (ref 80.0–100.0)
Monocytes Absolute: 0.4 10*3/uL (ref 0.1–1.0)
Monocytes Relative: 7 %
Neutro Abs: 3.5 10*3/uL (ref 1.7–7.7)
Neutrophils Relative %: 60 %
Platelets: 267 10*3/uL (ref 150–400)
RBC: 4.44 MIL/uL (ref 3.87–5.11)
RDW: 13.2 % (ref 11.5–15.5)
WBC: 5.9 10*3/uL (ref 4.0–10.5)
nRBC: 0 % (ref 0.0–0.2)

## 2023-01-13 LAB — URINALYSIS, ROUTINE W REFLEX MICROSCOPIC
Bilirubin Urine: NEGATIVE
Glucose, UA: NEGATIVE mg/dL
Hgb urine dipstick: NEGATIVE
Ketones, ur: NEGATIVE mg/dL
Nitrite: NEGATIVE
Protein, ur: NEGATIVE mg/dL
Specific Gravity, Urine: 1.01 (ref 1.005–1.030)
pH: 6 (ref 5.0–8.0)

## 2023-01-13 LAB — RESP PANEL BY RT-PCR (RSV, FLU A&B, COVID)  RVPGX2
Influenza A by PCR: NEGATIVE
Influenza B by PCR: NEGATIVE
Resp Syncytial Virus by PCR: NEGATIVE
SARS Coronavirus 2 by RT PCR: NEGATIVE

## 2023-01-13 LAB — LIPID PANEL
Cholesterol: 170 mg/dL (ref 0–200)
HDL: 73 mg/dL (ref >40)
LDL Cholesterol: 81 mg/dL (ref 0–99)
Total CHOL/HDL Ratio: 2.3 RATIO
Triglycerides: 80 mg/dL (ref <150)
VLDL: 16 mg/dL (ref 0–40)

## 2023-01-13 LAB — POC SARS CORONAVIRUS 2 AG: SARSCOV2ONAVIRUS 2 AG: NEGATIVE

## 2023-01-13 LAB — POC URINE PREG, ED: Preg Test, Ur: NEGATIVE

## 2023-01-13 LAB — COMPREHENSIVE METABOLIC PANEL
ALT: 10 U/L (ref 0–44)
AST: 16 U/L (ref 15–41)
Albumin: 4.2 g/dL (ref 3.5–5.0)
Alkaline Phosphatase: 56 U/L (ref 38–126)
Anion gap: 8 (ref 5–15)
BUN: 9 mg/dL (ref 6–20)
CO2: 26 mmol/L (ref 22–32)
Calcium: 9.4 mg/dL (ref 8.9–10.3)
Chloride: 104 mmol/L (ref 98–111)
Creatinine, Ser: 0.78 mg/dL (ref 0.44–1.00)
GFR, Estimated: >60 mL/min (ref >60)
Glucose, Bld: 93 mg/dL (ref 70–99)
Potassium: 3.7 mmol/L (ref 3.5–5.1)
Sodium: 138 mmol/L (ref 135–145)
Total Bilirubin: 0.6 mg/dL (ref 0.3–1.2)
Total Protein: 7.6 g/dL (ref 6.5–8.1)

## 2023-01-13 LAB — ETHANOL: Alcohol, Ethyl (B): <10 mg/dL (ref <10)

## 2023-01-13 LAB — POCT URINE DRUG SCREEN - MANUAL ENTRY (I-SCREEN)
POC Amphetamine UR: NOT DETECTED
POC Buprenorphine (BUP): NOT DETECTED
POC Cocaine UR: NOT DETECTED
POC Marijuana UR: POSITIVE — AB
POC Methadone UR: NOT DETECTED
POC Methamphetamine UR: NOT DETECTED
POC Morphine: NOT DETECTED
POC Oxazepam (BZO): NOT DETECTED
POC Oxycodone UR: NOT DETECTED
POC Secobarbital (BAR): NOT DETECTED

## 2023-01-13 LAB — TSH: TSH: 0.786 u[IU]/mL (ref 0.350–4.500)

## 2023-01-13 MED ORDER — TRAZODONE HCL 50 MG PO TABS: 50.0000 mg | ORAL_TABLET | Freq: Every evening | ORAL | Status: DC | PRN

## 2023-01-13 MED ORDER — OLANZAPINE 10 MG PO TBDP
20.0000 mg | ORAL_TABLET | Freq: Once | ORAL | Status: AC
  Administered 2023-01-13: 20 mg via ORAL
  Filled 2023-01-13: qty 2

## 2023-01-13 MED ORDER — DIPHENHYDRAMINE HCL 50 MG PO CAPS
50.0000 mg | ORAL_CAPSULE | Freq: Once | ORAL | Status: AC
  Administered 2023-01-13: 50 mg via ORAL
  Filled 2023-01-13: qty 1

## 2023-01-13 MED ORDER — ACETAMINOPHEN 325 MG PO TABS: 650.0000 mg | ORAL_TABLET | Freq: Four times a day (QID) | ORAL | Status: DC | PRN

## 2023-01-13 MED ORDER — MAGNESIUM HYDROXIDE 400 MG/5ML PO SUSP: 30.0000 mL | Freq: Every day | ORAL | Status: DC | PRN

## 2023-01-13 MED ORDER — DIVALPROEX SODIUM 500 MG PO DR TAB
500.0000 mg | DELAYED_RELEASE_TABLET | Freq: Two times a day (BID) | ORAL | Status: DC
  Administered 2023-01-13: 500 mg via ORAL
  Filled 2023-01-13 (×2): qty 1

## 2023-01-13 MED ORDER — PALIPERIDONE ER 6 MG PO TB24
9.0000 mg | ORAL_TABLET | Freq: Every day | ORAL | Status: DC
  Filled 2023-01-13: qty 1

## 2023-01-13 MED ORDER — ALUM & MAG HYDROXIDE-SIMETH 200-200-20 MG/5ML PO SUSP: 30.0000 mL | ORAL | Status: DC | PRN

## 2023-01-13 MED ORDER — HYDROXYZINE HCL 25 MG PO TABS
25.0000 mg | ORAL_TABLET | Freq: Three times a day (TID) | ORAL | Status: DC | PRN
  Administered 2023-01-14: 25 mg via ORAL
  Filled 2023-01-13: qty 1

## 2023-01-13 NOTE — ED Notes (Signed)
Pt is in the bed sleeping. Respirations are even and unlabored. No acute distress noted. Will continue to monitor for safety. 

## 2023-01-13 NOTE — ED Notes (Signed)
Pt with team member. She is being admitted onto the unit.

## 2023-01-13 NOTE — ED Notes (Signed)
Pt a/o. Denies SI/HI/AVH. She is able to answer assessment questions. Prefers to go by "Cam", usues "they" pronoun. They deny delusions.  States "I really want to go home". Med compliant without issues. Will continue to monitor for safety

## 2023-01-13 NOTE — Progress Notes (Signed)
Pt is asleep. Respirations are even and unlabored. No distress noted. Staff will monitor for pt's safety. 
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
## 2023-01-13 NOTE — Progress Notes (Signed)
   01/13/23 1422  [place] (Walk-ins at [facility] only)  How Did You Hear About [place]? Family/Friend  What Is the Reason for Your Visit/Call Today? Pt is here at parents request. Pt reports moving in with her parents after she was discharged from psych inpt in December. Pt denies SI, hI, AVH. Pt reports diagnosis of bipolar disorder. Pt is manic, and paranoid.  How Long Has This Been Causing You Problems? > than 6 months  Have You Recently Had Any Thoughts About Hurting Yourself? No  Are You Planning to Commit Suicide/Harm Yourself At This time? No  Have you Recently Had Thoughts About Viv[place]? No  Are You Planning To Harm Someone At This Time? No  Are you currently experiencing any auditory, visual or other hallucinations? No  Have You Used Any Alcohol or Drugs in the Past 24 Hours? No  Do you have any current medical co-morbidities that require immediate attention? No  Clinician description of patient physical appearance/behavior: Manic. delusional  What Do You Feel Would Help You the Most Today? Treatment for Depression or other mood problem  If access to [facility] was not available, would you have sought care in the Emergency Department? No  Determination of Need Urgent (48 hours)  Options For Referral Medication Management;Outpatient Therapy;Inpatient Hospitalization

## 2023-01-13 NOTE — Progress Notes (Signed)
Pt is admitted to [facility] due to mania and paranoia. Pt is alert and oriented X4 and appears to be manic. Pt is ambulatory and is oriented to staff/unit. Pt was cooperative with lab and skin assessment. Acne was noted on pt's face and back. Pt denies current  pain and SI/HI/AVH, plan or intent. Administered scheduled med with no issue. Staff will monitor for pt's safety.

## 2023-01-13 NOTE — ED Notes (Signed)
Pt sleeping in recliner bed. RR even and unlabored. No noted distress. Will continue to monitor for safety

## 2023-01-14 DIAGNOSIS — F250 Schizoaffective disorder, bipolar type: Secondary | ICD-10-CM | POA: Diagnosis not present

## 2023-01-14 LAB — HEMOGLOBIN A1C
Hgb A1c MFr Bld: 5.2 % (ref 4.8–5.6)
Mean Plasma Glucose: 103 mg/dL

## 2023-01-14 MED ORDER — ACETAMINOPHEN 325 MG PO TABS: 650.0000 mg | ORAL_TABLET | Freq: Four times a day (QID) | ORAL | 0 refills | Status: AC | PRN

## 2023-01-14 MED ORDER — OLANZAPINE 10 MG PO TBDP: 10.0000 mg | ORAL_TABLET | Freq: Two times a day (BID) | ORAL | 0 refills | Status: AC

## 2023-01-14 MED ORDER — OLANZAPINE 10 MG PO TBDP
10.0000 mg | ORAL_TABLET | ORAL | Status: AC
  Administered 2023-01-14: 10 mg via ORAL
  Filled 2023-01-14: qty 1

## 2023-01-14 MED ORDER — HYDROXYZINE HCL 25 MG PO TABS: 25.0000 mg | ORAL_TABLET | Freq: Three times a day (TID) | ORAL | 0 refills | Status: AC | PRN

## 2023-01-14 MED ORDER — NICOTINE POLACRILEX 4 MG MT GUM: 4.0000 mg | CHEWING_GUM | OROMUCOSAL | 0 refills | Status: AC | PRN

## 2023-01-14 MED ORDER — DIPHENHYDRAMINE HCL 50 MG PO CAPS
50.0000 mg | ORAL_CAPSULE | ORAL | Status: AC
  Administered 2023-01-14: 50 mg via ORAL
  Filled 2023-01-14: qty 1

## 2023-01-14 MED ORDER — OLANZAPINE 10 MG PO TBDP: 10.0000 mg | ORAL_TABLET | Freq: Two times a day (BID) | ORAL | Status: DC

## 2023-01-14 MED ORDER — OLANZAPINE 10 MG IM SOLR: 10.0000 mg | Freq: Once | INTRAMUSCULAR | Status: DC | PRN

## 2023-01-14 MED ORDER — DIVALPROEX SODIUM 500 MG PO DR TAB: 500.0000 mg | DELAYED_RELEASE_TABLET | Freq: Two times a day (BID) | ORAL | 0 refills | Status: AC

## 2023-01-14 MED ORDER — NICOTINE POLACRILEX 2 MG MT GUM
4.0000 mg | CHEWING_GUM | OROMUCOSAL | Status: DC | PRN
  Administered 2023-01-14 (×2): 4 mg via ORAL
  Filled 2023-01-14 (×2): qty 2

## 2023-01-14 MED ORDER — TRAZODONE HCL 50 MG PO TABS: 50.0000 mg | ORAL_TABLET | Freq: Every evening | ORAL | 0 refills | Status: AC | PRN

## 2023-01-14 NOTE — Progress Notes (Signed)
Provider Molli Barrows, FNP has provided requested document: Medical Clearance note per the request of Adela Ports. Nursing Mardene Sayer, RN has assisted with faxing IVC paperwork.  This CSW called Adela Ports and confirmed that all PENDING items have been received per Raford Pitcher with intake who provided CSW with the following accepting information.  Pt was accepted to [facility] 01/14/23; Bed Assignment-Adult Unit Room 1013. Adela Ports Address: [address], [place], [place] [address]  Pt meets inpatient criteria per Molli Barrows, [place]  Attending Physician will be Dr. Wilmon Arms, MD  Report can be called to: [phone]  Pt can arrive after: BED IS [place] Team notified: Care Team notified: Day [place], RN, Day [place], RN, [facility] Clayborne Dana, RN, Molli Barrows, NP, Mardene Sayer, RN, [place], LCSWA, [place], [place] 01/14/2023 @ 5:53 PM

## 2023-01-14 NOTE — ED Notes (Signed)
Patient refused invega and divalproex sodium will notify provider.

## 2023-01-14 NOTE — ED Notes (Signed)
Patient requested to get Zyprexa and benadryl states that she feel the medications works well for her. Rn notified provider and medication was ordered.

## 2023-01-14 NOTE — ED Notes (Signed)
Patient in bed Environment is secured. Will continue to monitor for safety.

## 2023-01-14 NOTE — Progress Notes (Addendum)
Pt has been accepted to [facility] [place] with Intake [phone] who informs that the following is PENDING: Medical clearance note from today, and IVC paperwork faxed to [phone]. Barb informed that accepting information would not be provided until PENDING items are received.  Nursing, Mardene Sayer, RN informed that she would fax IVC paperwork.  Care Team notified: Care Team notified: Day Buna, RN, Day [facility], RN, [place] [facility] Dana, RN, Molli Barrows, NP, Mardene Sayer, RN, [place], [place], Ava Loleta Dicker, MSW, [facility] 01/14/2023 4:58 PM

## 2023-01-14 NOTE — ED Notes (Signed)
Patient kneeling on flor whimpering. Patient safe on unit with continued  monitoring.

## 2023-01-14 NOTE — ED Notes (Signed)
Patient in room. Environment is secured. Will continue to monitor for safety. 

## 2023-01-14 NOTE — ED Provider Notes (Signed)
Behavioral Health Progress Note  Date and Time: 01/14/2023 10:46 AM Name: Candace Mcfarland MRN:  [id number]  Subjective:  "That olanzapine helped me sleep real good last night"   Diagnosis:  Final diagnoses:  Schizoaffective disorder, bipolar type ([place])   Total Time spent with patient: 20 minutes   Candace Mcfarland, 27 y.o., female patient seen face to face by this provider, consulted with Dr. Dwyane Dee; and chart reviewed on 01/14/23.    HPI: Candace Mcfarland 27 y.o., female patient presented to [facility] as a walk in, initially, voluntarily accompanied by both parents who are concern that patient's behavior and  manic symptoms are worsening. Currently under IVC petition.  Collateral contact# [phone] Lorn Junes and Dwane Hadley  (parents), patient currently resides with both parents.  On evaluation today, Candace Mcfarland and endorses that she slept Mcfarland after taking benadryl and olanzapine. Candace Mcfarland initially very pleasant on evaluation and her overall presentation shifts once being reminded of disposition to inpatient psychiatric hospitalization. She begins to make statements that , "I can take care of myself and sign to get an Air BNB or hotel". Candace Mcfarland further states, "I've done several 28 day hospital stays, it's time for the red head, mary martha to go to the hospital".   Candace Mcfarland further remarks, "I've angry at you right now." "You are trying to blow me up with medicine and put more acne bumps on my face".  During evaluation Candace Mcfarland is sitting up in bed in no obvious acute distress. She is alert, oriented x 4 and inattentive.  She is cooperative although vocally oppositional to treatment plan. Her mood remains labile with a congruent affect. She continues have pressured rapid speech   Objectively patient appears paranoid, delusional, and manic and appears to be psychotic.  Patient is able to converse, although spontaneously verbalizes flights of ideas and has fixed delusion that her parents  are poisoning her. Patient is unable to reliably contract for safety and meets inpatient criteria for inpatient psychiatric admission.     Additional Social History:    Pain Medications: SEE MAR Prescriptions: SEE MAR Over the Counter: SEE MAR History of alcohol / drug use?: No history of alcohol / drug abuse (PT REPORTS USING DELTA 8)                    Sleep: Good Achieved quality sleep overnight with agitation medications.   Appetite:  Good  Current Medications:  Current Facility-Administered Medications  Medication Dose Route Frequency Provider Last Rate Last Admin   acetaminophen (TYLENOL) tablet 650 mg  650 mg Oral Q6H PRN Scot Jun, NP       alum & mag hydroxide-simeth (MAALOX/MYLANTA) 200-200-20 MG/5ML suspension 30 mL  30 mL Oral Q4H PRN Scot Jun, NP       divalproex (DEPAKOTE) DR tablet 500 mg  500 mg Oral Q12H Scot Jun, NP   500 mg at 01/13/23 2033   hydrOXYzine (ATARAX) tablet 25 mg  25 mg Oral TID PRN Scot Jun, NP   25 mg at 01/14/23 0800   magnesium hydroxide (MILK OF MAGNESIA) suspension 30 mL  30 mL Oral Daily PRN Scot Jun, NP       nicotine polacrilex (NICORETTE) gum 4 mg  4 mg Oral Q4H PRN Scot Jun, NP   4 mg at 01/14/23 1016   OLANZapine (ZYPREXA) injection 10 mg  10 mg Intramuscular Once PRN Scot Jun, NP  paliperidone (INVEGA) 24 hr tablet 9 mg  9 mg Oral Daily Scot Jun, NP       traZODone (DESYREL) tablet 50 mg  50 mg Oral QHS PRN Scot Jun, NP       Current Outpatient Medications  Medication Sig Dispense Refill   QUEtiapine (SEROQUEL XR) 50 MG TB24 24 hr tablet Take 150 mg by mouth at bedtime.      Labs  Lab Results:  Admission on 01/13/2023  Component Date Value Ref Range Status   SARS Coronavirus 2 by RT PCR 01/13/2023 NEGATIVE  NEGATIVE Final   Influenza A by PCR 01/13/2023 NEGATIVE  NEGATIVE Final   Influenza B by PCR 01/13/2023 NEGATIVE  NEGATIVE Final    Comment: (NOTE) The Xpert Xpress SARS-CoV-2/FLU/RSV plus assay is intended as an aid in the diagnosis of influenza from Nasopharyngeal swab specimens and should not be used as a sole basis for treatment. Nasal washings and aspirates are unacceptable for Xpert Xpress SARS-CoV-2/FLU/RSV testing.  Fact Sheet for Patients: EntrepreneurPulse.com.au  Fact Sheet for Healthcare Providers: IncredibleEmployment.be  This test is not yet approved or cleared by the [place] FDA and has been authorized for detection and/or diagnosis of SARS-CoV-2 by FDA under an Emergency Use Authorization (EUA). This EUA will remain in effect (meaning this test can be used) for the duration of the COVID-19 declaration under Section 564(b)(1) of the Act, 21 U.S.C. section 360bbb-3(b)(1), unless the authorization is terminated or revoked.     Resp Syncytial Virus by PCR 01/13/2023 NEGATIVE  NEGATIVE Final   Comment: (NOTE) Fact Sheet for Patients: EntrepreneurPulse.com.au  Fact Sheet for Healthcare Providers: IncredibleEmployment.be  This test is not yet approved or cleared by the [place] FDA and has been authorized for detection and/or diagnosis of SARS-CoV-2 by FDA under an Emergency Use Authorization (EUA). This EUA will remain in effect (meaning this test can be used) for the duration of the COVID-19 declaration under Section 564(b)(1) of the Act, 21 U.S.C. section 360bbb-3(b)(1), unless the authorization is terminated or revoked.  Performed at [facility], [place] [address], [place], [place] [address]    WBC 01/13/2023 5.9  4.0 - 10.5 K/uL Final   RBC 01/13/2023 4.44  3.87 - 5.11 MIL/uL Final   Hemoglobin 01/13/2023 12.2  12.0 - 15.0 g/dL Final   HCT 01/13/2023 36.3  36.0 - 46.0 % Final   MCV 01/13/2023 81.8  80.0 - 100.0 fL Final   MCH 01/13/2023 27.5  26.0 - 34.0 pg Final   MCHC 01/13/2023 33.6  30.0 - 36.0  g/dL Final   RDW 01/13/2023 13.2  11.5 - 15.5 % Final   Platelets 01/13/2023 267  150 - 400 K/uL Final   nRBC 01/13/2023 0.0  0.0 - 0.2 % Final   Neutrophils Relative % 01/13/2023 60  % Final   Neutro Abs 01/13/2023 3.5  1.7 - 7.7 K/uL Final   Lymphocytes Relative 01/13/2023 30  % Final   Lymphs Abs 01/13/2023 1.8  0.7 - 4.0 K/uL Final   Monocytes Relative 01/13/2023 7  % Final   Monocytes Absolute 01/13/2023 0.4  0.1 - 1.0 K/uL Final   Eosinophils Relative 01/13/2023 2  % Final   Eosinophils Absolute 01/13/2023 0.1  0.0 - 0.5 K/uL Final   Basophils Relative 01/13/2023 1  % Final   Basophils Absolute 01/13/2023 0.1  0.0 - 0.1 K/uL Final   Immature Granulocytes 01/13/2023 0  % Final   Abs Immature Granulocytes 01/13/2023 0.01  0.00 -  0.07 K/uL Final   Performed at [facility], [place] [address] [place], [place] [address]   Sodium 01/13/2023 138  135 - 145 mmol/L Final   Potassium 01/13/2023 3.7  3.5 - 5.1 mmol/L Final   Chloride 01/13/2023 104  98 - 111 mmol/L Final   CO2 01/13/2023 26  22 - 32 mmol/L Final   Glucose, Bld 01/13/2023 93  70 - 99 mg/dL Final   Glucose reference range applies only to samples taken after fasting for at least 8 hours.   BUN 01/13/2023 9  6 - 20 mg/dL Final   Creatinine, Ser 01/13/2023 0.78  0.44 - 1.00 mg/dL Final   Calcium 01/13/2023 9.4  8.9 - 10.3 mg/dL Final   Total Protein 01/13/2023 7.6  6.5 - 8.1 g/dL Final   Albumin 01/13/2023 4.2  3.5 - 5.0 g/dL Final   AST 01/13/2023 16  15 - 41 U/L Final   ALT 01/13/2023 10  0 - 44 U/L Final   Alkaline Phosphatase 01/13/2023 56  38 - 126 U/L Final   Total Bilirubin 01/13/2023 0.6  0.3 - 1.2 mg/dL Final   GFR, Estimated 01/13/2023 >60  >60 mL/min Final   Comment: (NOTE) Calculated using the CKD-EPI Creatinine Equation (2021)    Anion gap 01/13/2023 8  5 - 15 Final   Performed at [facility] [address] [place], [place] [address]   Hgb A1c MFr Bld 01/13/2023 5.2  4.8 - 5.6 % Final   Comment:  (NOTE)         Prediabetes: 5.7 - 6.4         Diabetes: >6.4         Glycemic control for adults with diabetes: <7.0    Mean Plasma Glucose 01/13/2023 103  mg/dL Final   Comment: (NOTE) Performed At: [facility] [place], [place] [id number] Rush Farmer MD [id number]    Alcohol, Ethyl (B) 01/13/2023 <10  <10 mg/dL Final   Comment: (NOTE) Lowest detectable limit for serum alcohol is 10 mg/dL.  For medical purposes only. Performed at [facility], [place] [address] [place], [place] [address]    Cholesterol 01/13/2023 170  0 - 200 mg/dL Final   Triglycerides 01/13/2023 80  <150 mg/dL Final   HDL 01/13/2023 73  >40 mg/dL Final   Total CHOL/HDL Ratio 01/13/2023 2.3  RATIO Final   VLDL 01/13/2023 16  0 - 40 mg/dL Final   LDL Cholesterol 01/13/2023 81  0 - 99 mg/dL Final   Comment:        Total Cholesterol/HDL:CHD Risk Coronary Heart Disease Risk Table                     Men   Women  1/2 Average Risk   3.4   3.3  Average Risk       5.0   4.4  2 X Average Risk   9.6   7.1  3 X Average Risk  23.4   11.0        Use the calculated Patient Ratio above and the CHD Risk Table to determine the patient's CHD Risk.        ATP III CLASSIFICATION (LDL):  <100     mg/dL   Optimal  100-129  mg/dL   Near or Above                    Optimal  130-159  mg/dL  Borderline  160-189  mg/dL   High  >190     mg/dL   Very High Performed at [facility] [address], [place], [place] [address]    TSH 01/13/2023 0.786  0.350 - 4.500 uIU/mL Final   Comment: Performed by a 3rd Generation assay with a functional sensitivity of <=0.01 uIU/mL. Performed at [facility], [place] [address], [place], [place] [address]    Preg Test, Ur 01/13/2023 Negative  Negative Final   POC Amphetamine UR 01/13/2023 None Detected  NONE DETECTED (Cut Off Level 1000 ng/mL) Final   POC Secobarbital (BAR) 01/13/2023 None Detected  NONE DETECTED (Cut Off Level 300 ng/mL) Final    POC Buprenorphine (BUP) 01/13/2023 None Detected  NONE DETECTED (Cut Off Level 10 ng/mL) Final   POC Oxazepam (BZO) 01/13/2023 None Detected  NONE DETECTED (Cut Off Level 300 ng/mL) Final   POC Cocaine UR 01/13/2023 None Detected  NONE DETECTED (Cut Off Level 300 ng/mL) Final   POC Methamphetamine UR 01/13/2023 None Detected  NONE DETECTED (Cut Off Level 1000 ng/mL) Final   POC Morphine 01/13/2023 None Detected  NONE DETECTED (Cut Off Level 300 ng/mL) Final   POC Methadone UR 01/13/2023 None Detected  NONE DETECTED (Cut Off Level 300 ng/mL) Final   POC Oxycodone UR 01/13/2023 None Detected  NONE DETECTED (Cut Off Level 100 ng/mL) Final   POC Marijuana UR 01/13/2023 Positive (A)  NONE DETECTED (Cut Off Level 50 ng/mL) Final   Color, Urine 01/13/2023 YELLOW  YELLOW Final   APPearance 01/13/2023 HAZY (A)  CLEAR Final   Specific Gravity, Urine 01/13/2023 1.010  1.005 - 1.030 Final   pH 01/13/2023 6.0  5.0 - 8.0 Final   Glucose, UA 01/13/2023 NEGATIVE  NEGATIVE mg/dL Final   Hgb urine dipstick 01/13/2023 NEGATIVE  NEGATIVE Final   Bilirubin Urine 01/13/2023 NEGATIVE  NEGATIVE Final   Ketones, ur 01/13/2023 NEGATIVE  NEGATIVE mg/dL Final   Protein, ur 01/13/2023 NEGATIVE  NEGATIVE mg/dL Final   Nitrite 01/13/2023 NEGATIVE  NEGATIVE Final   Leukocytes,Ua 01/13/2023 TRACE (A)  NEGATIVE Final   RBC / HPF 01/13/2023 0-5  0 - 5 RBC/hpf Final   WBC, UA 01/13/2023 0-5  0 - 5 WBC/hpf Final   Bacteria, UA 01/13/2023 RARE (A)  NONE SEEN Final   Squamous Epithelial / HPF 01/13/2023 6-10  0 - 5 /HPF Final   Mucus 01/13/2023 PRESENT   Final   Amorphous Crystal 01/13/2023 PRESENT   Final   Performed at [facility], [place] [address], [place], [place] [address]   SARSCOV2ONAVIRUS 2 AG 01/13/2023 NEGATIVE  NEGATIVE Final   Comment: (NOTE) SARS-CoV-2 antigen NOT DETECTED.   Negative results are presumptive.  Negative results do not preclude SARS-CoV-2 infection and should not be used as the sole basis  for treatment or other patient management decisions, including infection  control decisions, particularly in the presence of clinical signs and  symptoms consistent with COVID-19, or in those who have been in contact with the virus.  Negative results must be combined with clinical observations, patient history, and epidemiological information. The expected result is Negative.  Fact Sheet for Patients: HandmadeRecipes.com.cy  Fact Sheet for Healthcare Providers: FuneralLife.at  This test is not yet approved or cleared by the [place] FDA and  has been authorized for detection and/or diagnosis of SARS-CoV-2 by FDA under an Emergency Use Authorization (EUA).  This EUA will remain in effect (meaning this test can be used) for the  duration of  the COV                          ID-19 declaration under Section 564(b)(1) of the Act, 21 U.S.C. section 360bbb-3(b)(1), unless the authorization is terminated or revoked sooner.      Blood Alcohol level:  Lab Results  Component Value Date   ETH <10 [id number]    Metabolic Disorder Labs: Lab Results  Component Value Date   HGBA1C 5.2 01/13/2023   MPG 103 01/13/2023   No results found for: "PROLACTIN" Lab Results  Component Value Date   CHOL 170 01/13/2023   TRIG 80 01/13/2023   HDL 73 01/13/2023   CHOLHDL 2.3 01/13/2023   VLDL 16 01/13/2023   LDLCALC 81 01/13/2023   LDLCALC 108 (H) 05/18/2016     Physical Findings   PHQ2-9    Flowsheet Row Office Visit from 04/03/2019 in [place] at [facility] Office Visit from 05/20/2017 in [place] at [facility] Office Visit from 05/18/2016 in [place] at [facility] Office Visit from 06/19/2015 in [place] at [facility] Visit from 11/07/2014 in [place] at [place] [facility]  PHQ-2 Total Score 0 0 0 0 0      [place] ED  from 01/13/2023 in [place] No Risk        Musculoskeletal  Strength & Muscle Tone: within normal limits Gait & Station: normal Patient leans: N/A  Psychiatric Specialty Exam  Presentation  General Appearance:  Appropriate for Environment  Eye Contact: Fair  Speech: Pressured  Speech Volume: Increased  Handedness: Right   Mood and Affect  Mood: Anxious; Euphoric; Angry; Irritable; Labile  Affect: Labile; Full Range   Thought Process  Thought Processes: Disorganized  Descriptions of Associations:Tangential  Orientation:Full (Time, Place and Person)  Thought Content:Paranoid Ideation  Diagnosis of Schizophrenia or Schizoaffective disorder in past: Yes  Duration of Psychotic Symptoms: Greater than six months   Hallucinations:Hallucinations: Other (comment)  Ideas of Reference:Delusions; Paranoia; Percusatory  Suicidal Thoughts:Suicidal Thoughts: No  Homicidal Thoughts:Homicidal Thoughts: No   Sensorium  Memory: Immediate Fair; Remote Good; Recent Fair  Judgment: Impaired  Insight: Lacking   Executive Functions  Concentration: Poor  Attention Span: Poor  Recall: [place] of Knowledge: Good  Language: Good   Psychomotor Activity  Psychomotor Activity: Psychomotor Activity: Mannerisms   Assets  Assets: Communication Skills; Desire for Improvement; Financial Resources/Insurance; Physical Health; Social Support   Sleep  Sleep: Sleep: Good (patient slept approximately 10 hours with olanzipine and benardyl overnight) Number of Hours of Sleep: 0 (Poor sleep for months, little to no sleep over the last few days)   Nutritional Assessment (For OBS and FBC admissions only) Has the patient had a weight loss or gain of 10 pounds or more in the last 3 months?: No Has the patient had a decrease in food intake/or appetite?: No Does the patient have dental problems?: No Does the  patient have eating habits or behaviors that may be indicators of an eating disorder including binging or inducing vomiting?: No Has the patient recently lost weight without trying?: 0 Has the patient been eating poorly because of a decreased appetite?: 0 Malnutrition Screening Tool Score: 0    Physical Exam  Physical Exam Vitals reviewed.  Constitutional:      Appearance: Normal appearance.  HENT:  Head: Normocephalic.     Right Ear: External ear normal.     Left Ear: External ear normal.     Nose: Nose normal.  Eyes:     Pupils: Pupils are equal, round, and reactive to light.  Cardiovascular:     Rate and Rhythm: Normal rate.  Pulmonary:     Effort: Pulmonary effort is normal.  Musculoskeletal:        General: Normal range of motion.     Cervical back: Normal range of motion.  Neurological:     General: No focal deficit present.     Mental Status: She is alert.    Review of Systems  Psychiatric/Behavioral:  Positive for substance abuse. The patient is nervous/anxious.    Blood pressure 99/71, pulse 63, temperature 98.2 F (36.8 C), temperature source Oral, resp. rate 16, SpO2 100 %. There is no height or weight on file to calculate BMI.  Treatment Plan Summary: Schizoaffective disorder, bipolar type, with mania, paranoia, mood lablity. Given patient has had success with Lorayne Bender will start oral Invega 9 mg daily and restart Depakote for mood stabilization.  As needed medications include trazodone for insomnia, hydroxyzine for anxiety.  Patient initially agitated on arrival patient given a one-time dose of olanzapine 20 mg with 50 mg of Benadryl orally.  Patient is currently under IVC petition. UDS significant for marijuana following.  ECG NSR with QTc 467.  Urine hCG is negative. Patient case review and discussed with Dr. Dwyane Dee, patient meets criteria for inpatient psychiatric treatment.  Patient is unable to reliably contract for safety at this time due to psychosis.  LCSW, notified of disposition.      Molli Barrows, FNP-C, PMHNP-BC  [facility] [phone]  01/14/2023 10:46 AM

## 2023-01-14 NOTE — ED Notes (Signed)
Patient alert and oriented x 3. Denies SI/HI/AVH. Denies intent or plan to harm self or others. Routine conducted according to faculty protocol. Encourage patient to notify staff with any needs or concerns. Patient verbalized agreement and understanding. Will continue to monitor for safety. 

## 2023-01-14 NOTE — Discharge Instructions (Addendum)
Candace Mcfarland. Nursing Mardene Sayer, RN has assisted with faxing IVC paperwork.  This CSW called Candace Mcfarland and confirmed that all PENDING items have been received per Raford Pitcher with intake who provided CSW with the following accepting information.  Pt was accepted to [facility] 01/14/23; Bed Assignment-Adult Unit Room 1013. Candace Mcfarland Address: [address], [place], [place] [address]  Pt meets inpatient criteria per Molli Barrows, FNP  Attending Physician will be Dr. Wilmon Arms, MD  Report can be called to: [phone]  Pt can arrive after: BED IS READY NOW

## 2023-01-14 NOTE — ED Notes (Signed)
Patient requested nicotine gum. Provider was notified that she did not take moring medication.

## 2023-01-14 NOTE — ED Notes (Signed)
[employer] was called to serve IVC papers.

## 2023-01-14 NOTE — ED Notes (Signed)
Patient ask for medication for aniexty.

## 2023-01-14 NOTE — ED Notes (Signed)
Patient in bedroom. Environment is secured. Will continue to monitor for safety.

## 2023-01-14 NOTE — ED Notes (Signed)
Rn called report to Lorrie at [facility] states to call back once we know that she would be coming. Told Lorrie that rn was waiting on sheriff to call back and that the patient may not be transported until tomorrow morning. Lorrie states that that would be ok.Will pass on to night shift.

## 2023-01-14 NOTE — ED Notes (Signed)
Patient A&O x 4, ambulatory. Patient discharged in no acute distress. Patient denied SI/HI, A/VH upon discharge. Patient reported mood 10/10.  Pt belongings returned to patient from locker #  intact. Patient escorted to lobby via staff for transport to destination.via shierff  Safety maintained.  Patient was transported to [facility] via [facility]ff and she was ivc'd

## 2023-01-14 NOTE — ED Notes (Signed)
Report was given to timeo rn at [facility] [facility]

## 2023-01-14 NOTE — Progress Notes (Signed)
LCSW Progress Note  [id number]   [name]  01/14/2023  3:53 PM  Description:   Inpatient Psychiatric Referral  Patient was recommended inpatient per Molli Barrows, NP. There are no available beds at [facility]. Patient was referred to the following facilities:   Destination  Service Provider Address Phone [facility]  [address] [place] [id number] [phone]  [facility]  [place], [place] [address] [phone] [place]  [address], [place] [address] [phone]  [facility]  [place], [place] [address] [phone]  [facility]  [address] [place] [address] [phone] [place]  [place], [place] [address] [phone] [facility]  [id number] [place] [address] [phone] [facility]  [address] [place], [place] [address] [phone] [place] [place] [address] [place]  [facility]  [address] [place] [address] [phone]  [facility]  [address] [place] [address] [phone]  [place] [address], [place] [address] [phone]  [facility]  [address], [place] [address] [phone] [place]  [address] [place] [address] [phone] [facility]  [address], [place] [address] [phone] [facility]  [address] [place] [address] [place]  [address], [place] [address] [phone] [facility]  [address] [place] [address] [phone] [facility]  [address], [place] [address] [place]  [facility]  [address] [place] [address] [phone]  [facility]  [address] [place] [address] [place]  [facility]  [address] [place] [address] [phone] [place]  [address], [place] [address] [place] [place] [address] [phone]  [facility]  [address], [place] [address] [phone] [place]  [address] [place] [address] [phone]  [facility]  [address], [place] [address] [phone]    Situation ongoing, CSW to continue following and update chart as more information becomes available.      Herbie [place]  01/14/2023 3:53 PM

## 2023-01-14 NOTE — ED Notes (Signed)
Rn called sheriff to see if she can get transport for patient to facility. Waiting for sheriff to call back.

## 2023-01-14 NOTE — ED Provider Notes (Signed)
Candace Mcfarland is a 27 year old female, under IVC petition, admitted to [facility] on 01/13/2023, due to psychosis secondary to schizoaffective disorder.  Patient has been evaluated medically and labs reviewed, patient has been medically cleared for admission to an inpatient psychiatric facility.    Molli Barrows, FNP-C, PMHNP-BC  [facility] [phone]
